# Patient Record
Sex: Female | Born: 1995
Health system: Southern US, Community
[De-identification: ages and names within clinical notes are randomized; demographics above are authoritative.]

## PROBLEM LIST (undated history)

## (undated) DIAGNOSIS — F909 Attention-deficit hyperactivity disorder, unspecified type: Secondary | ICD-10-CM

## (undated) DIAGNOSIS — N926 Irregular menstruation, unspecified: Secondary | ICD-10-CM

## (undated) DIAGNOSIS — F3130 Bipolar disorder, current episode depressed, mild or moderate severity, unspecified: Secondary | ICD-10-CM

## (undated) DIAGNOSIS — N201 Calculus of ureter: Secondary | ICD-10-CM

## (undated) HISTORY — PX: TYMPANOSTOMY TUBE PLACEMENT: SHX32

---
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
## 2016-08-31 DIAGNOSIS — F340 Cyclothymic disorder: Secondary | ICD-10-CM | POA: Diagnosis not present

## 2016-10-17 DIAGNOSIS — S62607A Fracture of unspecified phalanx of left little finger, initial encounter for closed fracture: Secondary | ICD-10-CM | POA: Diagnosis not present

## 2016-10-25 DIAGNOSIS — S62607D Fracture of unspecified phalanx of left little finger, subsequent encounter for fracture with routine healing: Secondary | ICD-10-CM | POA: Diagnosis not present

## 2016-11-19 DIAGNOSIS — S62607D Fracture of unspecified phalanx of left little finger, subsequent encounter for fracture with routine healing: Secondary | ICD-10-CM | POA: Diagnosis not present

## 2017-02-15 DIAGNOSIS — F340 Cyclothymic disorder: Secondary | ICD-10-CM | POA: Diagnosis not present

## 2018-02-06 DIAGNOSIS — Z124 Encounter for screening for malignant neoplasm of cervix: Secondary | ICD-10-CM | POA: Diagnosis not present

## 2018-02-06 DIAGNOSIS — Z113 Encounter for screening for infections with a predominantly sexual mode of transmission: Secondary | ICD-10-CM | POA: Diagnosis not present

## 2018-02-06 DIAGNOSIS — Z6836 Body mass index (BMI) 36.0-36.9, adult: Secondary | ICD-10-CM | POA: Diagnosis not present

## 2018-02-06 DIAGNOSIS — Z01419 Encounter for gynecological examination (general) (routine) without abnormal findings: Secondary | ICD-10-CM | POA: Diagnosis not present

## 2018-02-17 DIAGNOSIS — M25572 Pain in left ankle and joints of left foot: Secondary | ICD-10-CM | POA: Diagnosis not present

## 2018-03-26 DIAGNOSIS — M25572 Pain in left ankle and joints of left foot: Secondary | ICD-10-CM | POA: Diagnosis not present

## 2018-04-24 DIAGNOSIS — Z6836 Body mass index (BMI) 36.0-36.9, adult: Secondary | ICD-10-CM | POA: Diagnosis not present

## 2018-04-24 DIAGNOSIS — W268XXA Contact with other sharp object(s), not elsewhere classified, initial encounter: Secondary | ICD-10-CM | POA: Diagnosis not present

## 2018-04-24 DIAGNOSIS — S61210A Laceration without foreign body of right index finger without damage to nail, initial encounter: Secondary | ICD-10-CM | POA: Diagnosis not present

## 2018-05-26 DIAGNOSIS — L300 Nummular dermatitis: Secondary | ICD-10-CM | POA: Diagnosis not present

## 2018-05-26 DIAGNOSIS — D224 Melanocytic nevi of scalp and neck: Secondary | ICD-10-CM | POA: Diagnosis not present

## 2018-05-26 DIAGNOSIS — D225 Melanocytic nevi of trunk: Secondary | ICD-10-CM | POA: Diagnosis not present

## 2018-06-04 ENCOUNTER — Encounter

## 2018-06-04 ENCOUNTER — Ambulatory Visit (INDEPENDENT_AMBULATORY_CARE_PROVIDER_SITE_OTHER): Payer: BLUE CROSS/BLUE SHIELD | Admitting: Psychiatry

## 2018-06-04 ENCOUNTER — Encounter: Payer: Self-pay | Admitting: Psychiatry

## 2018-06-04 VITALS — BP 122/74 | HR 76 | Ht 63.0 in | Wt 208.0 lb

## 2018-06-04 DIAGNOSIS — F340 Cyclothymic disorder: Secondary | ICD-10-CM | POA: Diagnosis not present

## 2018-06-04 DIAGNOSIS — F902 Attention-deficit hyperactivity disorder, combined type: Secondary | ICD-10-CM | POA: Diagnosis not present

## 2018-06-04 MED ORDER — METHYLPHENIDATE HCL ER (OSM) 36 MG PO TBCR: 36.0000 mg | EXTENDED_RELEASE_TABLET | Freq: Every day | ORAL | 0 refills | Status: DC

## 2018-06-04 NOTE — Progress Notes (Signed)
[facility]  Patient ID: JOZEY SCHOOLER,  MRN: [id number]  PCP: Loyola Mast, MD  Date of Evaluation: 06/04/2018 Time spent:20 minutes  Chief Complaint:  Chief Complaint    ADHD; Depression      HISTORY/CURRENT STATUS: Gage is seen individually face-to-face with consent not collateral for psychiatric interview and exam in 23-month evaluation and management of cyclothymia and ADHD being 9 months overdue.  She discontinued bupropion describing side effects and not helping in March of this year 6 months after last appointment and now presents for ADHD medications.  She has collaborated with mother in assessing symptoms and associations finding necessity to improve focus at least over the latter half of her day shift on the job at [employer] through the late afternoon into the early evening.  Economics are difficult living alone with 2 dogs.  She feels that ADHD symptoms are triggering some depression this fall but denies recent hypomania.  She has no self-harm, mania, ecchymosis or substance use.   Individual Medical History/ Review of Systems: Changes? :No   Allergies: Patient has no known allergies.   Newly starting Concerta off of bupropion Current Medications:  Current Outpatient Medications:  .  cephALEXin (KEFLEX) 500 MG capsule, Take 1 capsule (500 mg total) by mouth 2 (two) times daily., Disp: 6 capsule, Rfl: 0 .  ibuprofen (ADVIL,MOTRIN) 200 MG tablet, Take 400 mg by mouth every 8 (eight) hours as needed for pain. , Disp: , Rfl:  .  Meth-Hyo-M Bl-Na Phos-Ph Sal (URIBEL) 118 MG CAPS, Take 1 capsule by mouth every 6 (six) hours as needed (for bladder)., Disp: , Rfl:  .  methylphenidate 36 MG PO CR tablet, Take 1 tablet (36 mg total) by mouth daily before lunch., Disp: 30 tablet, Rfl: 0 .  [START ON 07/04/2018] methylphenidate 36 MG PO CR tablet, Take 1 tablet (36 mg total) by mouth daily before lunch., Disp: 30 tablet, Rfl: 0 .  [START ON 08/03/2018] methylphenidate 36 MG PO  CR tablet, Take 1 tablet (36 mg total) by mouth daily before lunch., Disp: 30 tablet, Rfl: 0 .  Multiple Vitamin (MULTIVITAMIN WITH MINERALS) TABS, Take 1 tablet by mouth every morning. , Disp: , Rfl:  .  ondansetron (ZOFRAN-ODT) 4 MG disintegrating tablet, Take 4 mg by mouth every 8 (eight) hours as needed for nausea., Disp: , Rfl:  .  oxyCODONE-acetaminophen (ROXICET) 5-325 MG per tablet, Take 2 tablets by mouth every 6 (six) hours as needed for pain., Disp: 30 tablet, Rfl: 0 .  promethazine (PHENERGAN) 12.5 MG tablet, Take 12.5 mg by mouth every 6 (six) hours as needed for nausea., Disp: , Rfl:  .  tamsulosin (FLOMAX) 0.4 MG CAPS, Take 0.4 mg by mouth daily after supper. , Disp: , Rfl:  Medication Side Effects: fatigue/weakness and GI irritation  Family Medical/ Social History: Changes? No  MENTAL HEALTH EXAM: Muscle strength 5/5, postural reflexes 0/0 and AIMS = 0 Blood pressure 122/74, pulse 76, height 5\' 3"  (1.6 m), weight 208 lb (94.3 kg).Body mass index is 36.85 kg/m.  General Appearance: Casual, Fairly Groomed, Guarded and Obese  Eye Contact:  Good  Speech:  Clear and Coherent  Volume:  Normal  Mood:  Dysphoric and Euthymic  Affect:  Full Range  Thought Process:  Goal Directed  Orientation:  Full (Time, Place, and Person)  Thought Content: Obsessions   Suicidal Thoughts:  No  Homicidal Thoughts:  No  Memory:  Immediate;   Good Remote;   Good  Judgement:  Fair  Insight:  Fair  Psychomotor Activity:  Increased  Concentration:  Concentration: Fair and Attention Span: Poor  Recall:  Good  Fund of Knowledge: Good  Language: Good  Assets:  Resilience Social Support Talents/Skills  ADL's:  Intact  Cognition: WNL  Prognosis:  Good    DIAGNOSES:    ICD-10-CM   1. Attention deficit hyperactivity disorder (ADHD), combined type, moderate F90.2 methylphenidate 36 MG PO CR tablet    methylphenidate 36 MG PO CR tablet    methylphenidate 36 MG PO CR tablet  2. Cyclothymic  disorder F34.0     Receiving Psychotherapy: No    RECOMMENDATIONS: Bupropion is discontinued and mood monitored as per education today.  Reeducation on Concerta 36 mg every morning sent as a month supply each for January, February, and March to [facility] requires for ADHD follow-up in 6 months.  Close monitoring for hypomania as planned in case needed.  Therapeutic integration of symptom the last 10 years is processed.   Chauncey Mann, MD

## 2018-06-11 ENCOUNTER — Telehealth: Payer: Self-pay

## 2018-06-11 ENCOUNTER — Telehealth: Payer: Self-pay | Admitting: Psychiatry

## 2018-06-11 MED ORDER — METHYLPHENIDATE HCL ER 36 MG PO TB24: 36.0000 mg | ORAL_TABLET | Freq: Every day | ORAL | 0 refills | Status: DC

## 2018-06-11 NOTE — Telephone Encounter (Signed)
Let pt know methylphenidate er 36 mg is not covered under her plan. Instructed pt to contact insurance to get names of stimulants that are included on her plan. Pt verbalized understanding and will call back with the information.

## 2018-06-11 NOTE — Telephone Encounter (Signed)
Patient phoned office that insurance needed prior authorization requiring office contact to [facility] to conclude that the insurance does not cover Methylphenidate HCl ER 36 mg TBCR regardless of any authorization process.  As offered to the family by [facility] benefit provision guideline, mother addressed this reporting to office the need for written generic including d.a.w. 9 on the eScription sent [facility] [address] for a month supply canceling the preceding generic prescription of 06/04/2018 medically necessary with no contraindication.

## 2018-06-24 ENCOUNTER — Telehealth: Payer: Self-pay | Admitting: Psychiatry

## 2018-06-24 DIAGNOSIS — F902 Attention-deficit hyperactivity disorder, combined type: Secondary | ICD-10-CM

## 2018-06-24 MED ORDER — DEXMETHYLPHENIDATE HCL ER 20 MG PO CP24: 20.0000 mg | ORAL_CAPSULE | Freq: Every day | ORAL | 0 refills | Status: DC

## 2018-06-24 NOTE — Telephone Encounter (Signed)
Patients mother Fuller PlanConnie Schwer left vm stating she is having trouble getting the script for  Concerta filled the cost is $322 a month [employer] cannot afford this medication, Junious DresserConnie would like a call to discuss other alternatives that will be cost effective [phone]

## 2018-06-24 NOTE — Telephone Encounter (Signed)
Mother phones that despite this office and her family following all instructions of the insurance company after prior authorizations, the cost is prohibitive at $322 a month for methylphenidate 36 mg CR once daily.  She now has from 3 different people at [employer] company that they expect dexmethylphenidate ER or Focalin XR so that 20 mg XR every morning #30 no refills is [facility] [address] [facility] as outlined closer for patient who has had to make multiple trips already as mother predicts more prior authorizations as though no product is approved by the insurance.  [place] registry of controlled substances documents no stimulant has been filled by the patient since 2018 current being medically necessary with no contraindication having taken the Focalin 20 mg XR 09/11/2005.

## 2018-07-07 ENCOUNTER — Telehealth: Payer: Self-pay | Admitting: Psychiatry

## 2018-07-07 ENCOUNTER — Telehealth: Payer: Self-pay

## 2018-07-07 DIAGNOSIS — F902 Attention-deficit hyperactivity disorder, combined type: Secondary | ICD-10-CM

## 2018-07-07 MED ORDER — DEXMETHYLPHENIDATE HCL ER 20 MG PO CP24: 20.0000 mg | ORAL_CAPSULE | Freq: Every day | ORAL | 0 refills | Status: DC

## 2018-07-07 NOTE — Telephone Encounter (Signed)
Patient and [facility] report that 06/24/2018 Focalin 20 mg XR every morning #30 is not received there even though registered by epic as having been sent and [place] registry confirms not filled.  Prescription is resent by E scription documented as sent by epic to [facility] [address].

## 2018-07-07 NOTE — Telephone Encounter (Signed)
Pharmacy called and said that they never received the script for the focalin. Can you re [facility]ribe [facility] [address]

## 2018-07-07 NOTE — Telephone Encounter (Signed)
Prior authorization approved for Focalin XR 20mg  through [facility] effective 07/07/2018-07/06/2021  [facility] [phone]

## 2018-08-07 ENCOUNTER — Telehealth: Payer: Self-pay

## 2018-08-07 ENCOUNTER — Telehealth: Payer: Self-pay | Admitting: Psychiatry

## 2018-08-07 DIAGNOSIS — F902 Attention-deficit hyperactivity disorder, combined type: Secondary | ICD-10-CM

## 2018-08-07 MED ORDER — AMPHETAMINE-DEXTROAMPHETAMINE 5 MG PO TABS
5.0000 mg | ORAL_TABLET | Freq: Three times a day (TID) | ORAL | 0 refills | Status: DC
Start: 2018-08-07 — End: 2019-05-04

## 2018-08-07 NOTE — Telephone Encounter (Signed)
Prior authorization submitted to [facility] for Amphetamine-dextroamphetamine 5mg  effective 08/07/2018-08/06/2021  [facility] approval

## 2018-08-07 NOTE — Telephone Encounter (Signed)
Per mother [name] has been unable to fill any of the ADD Rxs sent in due either to increased cost or need of PA.  Mother has checked with [facility] and they can use a coupon for regular Adderall and she can afford this. Can this be sent in for her?

## 2018-08-07 NOTE — Telephone Encounter (Signed)
Mother phones as patient will not do so but directs that she will accept mother's help to for ADHD.  The Focalin although preauthorized would cost them $120 instead of the [employer] company promised them.  Patient needs treatment for the ADHD particularly relative to current employment.  They have a good Rx coupon for Adderall IR tablet cost of which they are willing to accept.  Though Adderall had a horrible effect on her mood as the first stimulant she never took when very young, they are willing to try the 5 mg IR Adderall up to 3 times daily #90 with no refill medically necessary with no contraindication sent to [facility].

## 2018-12-03 ENCOUNTER — Ambulatory Visit: Payer: BLUE CROSS/BLUE SHIELD | Admitting: Psychiatry

## 2019-05-04 ENCOUNTER — Ambulatory Visit (INDEPENDENT_AMBULATORY_CARE_PROVIDER_SITE_OTHER): Payer: BC Managed Care – PPO | Admitting: Psychiatry

## 2019-05-04 ENCOUNTER — Encounter: Payer: Self-pay | Admitting: Psychiatry

## 2019-05-04 ENCOUNTER — Other Ambulatory Visit: Payer: Self-pay

## 2019-05-04 VITALS — Ht 63.0 in | Wt 206.0 lb

## 2019-05-04 DIAGNOSIS — F340 Cyclothymic disorder: Secondary | ICD-10-CM | POA: Diagnosis not present

## 2019-05-04 DIAGNOSIS — F411 Generalized anxiety disorder: Secondary | ICD-10-CM | POA: Diagnosis not present

## 2019-05-04 DIAGNOSIS — F902 Attention-deficit hyperactivity disorder, combined type: Secondary | ICD-10-CM

## 2019-05-04 MED ORDER — METHYLPHENIDATE HCL 20 MG PO TABS: 20.0000 mg | ORAL_TABLET | Freq: Two times a day (BID) | ORAL | 0 refills | Status: DC

## 2019-05-04 MED ORDER — FLUOXETINE HCL 20 MG PO CAPS: 20.0000 mg | ORAL_CAPSULE | Freq: Every day | ORAL | 5 refills | Status: DC

## 2019-05-04 NOTE — Progress Notes (Signed)
[facility]  Patient ID: [name],  MRN: [id number]  PCP: Veronica Hummer, MD  Date of Evaluation: 05/04/2019 Time spent:20 minutes from 1040 to 1100  Chief Complaint:  Chief Complaint    ADHD; Anxiety; Depression; Manic Behavior      HISTORY/CURRENT STATUS: [name] is seen onsite in office 20 minutes face-to-face individually with consent with epic collateral for psychiatric interview and exam in 71-month evaluation and management of ADHD, generalized anxiety, and cyclothymia.  She is 5 months overdue having months of insurance and pharmacy debates about the expense and psychodynamics of her ADHD treatment after last appointment changing from extended release methylphenidate or Focalin to Adderall 5 mg IR 3 times daily with last and only fill being 08/07/2018 per [facility] registry dispensing.  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  She reports her 10-year anniversary with girlfriend spent in the [place] with girlfriend now requires the patient to get an appointment for anxiety and some seasonal flareups to depressive symptoms with no hypomania or anger outburst now for years.  Patient states she is still on parents insurance but lives by herself.  Her girlfriend recommends Prozac she has never taken an SSRI in the past due to her anger and mood swings.  She also seeks methylphenidate rather than amphetamine in the short acting tablets especially for school.  She debates about pharmacies and health plans as well as cost and quality of care.  She has no mania, suicidality, psychosis, or delirium.  The she has never had a manic episode though hypomanic and reactive dysregulated mood symptoms are significantly resolved and worked through. The benefits and risks are therefore processed relative to Prozac, though by experience she will tolerate the Ritalin well.  Anxiety Presents  for initial visit. Onset was more than 5 years ago. The problem has been waxing and waning. Symptoms include decreased concentration, depressed mood, excessive worry, insomnia and nervous/anxious behavior. Patient reports no compulsions, irritability, obsessions, panic or suicidal ideas. Symptoms occur most days. The severity of symptoms is moderate and causing significant distress. The symptoms are aggravated by social activities and work stress. The quality of sleep is fair. Nighttime awakenings: one to two.   Risk factors include change in medication, a major life event and family history. Her past medical history is significant for anxiety/panic attacks and depression. Past treatments include counseling (CBT), lifestyle changes and non-benzodiazephine anxiolytics. The treatment provided moderate relief. Compliance with prior treatments has been poor. Prior compliance problems include difficulty with treatment plan and medication issues.    Individual Medical History/ Review of Systems: Changes? :No Weight is down 2 pounds in the last year  Allergies: Patient has no known allergies.  Current Medications:  Current Outpatient Medications:  .  cephALEXin (KEFLEX) 500 MG capsule, Take 1 capsule (500 mg total) by mouth 2 (two) times daily., Disp: 6 capsule, Rfl: 0 .  FLUoxetine (PROZAC) 20 MG capsule, Take 1 capsule (20 mg total) by mouth daily., Disp: 30 capsule, Rfl: 5 .  ibuprofen (ADVIL,MOTRIN) 200 MG tablet, Take 400 mg by mouth every 8 (eight) hours as needed for pain. , Disp: , Rfl:  .  Meth-Hyo-M Bl-Na Phos-Ph Sal (URIBEL) 118 MG CAPS, Take 1 capsule by mouth every 6 (six) hours as needed (for bladder)., Disp: , Rfl:  .  methylphenidate (RITALIN) 20 MG tablet, Take 1 tablet (20 mg total) by mouth  2 (two) times daily., Disp: 60 tablet, Rfl: 0 .  [START ON 06/03/2019] methylphenidate (RITALIN) 20 MG tablet, Take 1 tablet (20 mg total) by mouth 2 (two) times daily., Disp: 60 tablet, Rfl: 0 .   [START ON 07/03/2019] methylphenidate (RITALIN) 20 MG tablet, Take 1 tablet (20 mg total) by mouth 2 (two) times daily., Disp: 60 tablet, Rfl: 0 .  Multiple Vitamin (MULTIVITAMIN WITH MINERALS) TABS, Take 1 tablet by mouth every morning. , Disp: , Rfl:  .  ondansetron (ZOFRAN-ODT) 4 MG disintegrating tablet, Take 4 mg by mouth every 8 (eight) hours as needed for nausea., Disp: , Rfl:  .  oxyCODONE-acetaminophen (ROXICET) 5-325 MG per tablet, Take 2 tablets by mouth every 6 (six) hours as needed for pain., Disp: 30 tablet, Rfl: 0 .  promethazine (PHENERGAN) 12.5 MG tablet, Take 12.5 mg by mouth every 6 (six) hours as needed for nausea., Disp: , Rfl:  .  tamsulosin (FLOMAX) 0.4 MG CAPS, Take 0.4 mg by mouth daily after supper. , Disp: , Rfl:    Medication Side Effects: none  Family Medical/ Social History: Changes? No  MENTAL HEALTH EXAM:  Height 5\' 3"  (1.6 m), weight 206 lb (93.4 kg).Body mass index is 36.49 kg/m. Muscle strengths and tone 5/5, postural reflexes and gait 0/0, and AIMS = 0 otherwise deferred for coronavirus shutdown  General Appearance: Casual, Fairly Groomed, Guarded, Meticulous and Obese  Eye Contact:  Good  Speech:  Clear and Coherent, Normal Rate, Slow and Talkative  Volume:  Normal  Mood:  Anxious, Dysphoric, Euthymic and Worthless  Affect:  Congruent, Depressed, Inappropriate, Labile, Restricted and Anxious  Thought Process:  Coherent, Goal Directed, Irrelevant, Linear and Descriptions of Associations: Tangential  Orientation:  Full (Time, Place, and Person)  Thought Content: Ilusions, Rumination and Tangential   Suicidal Thoughts:  No  Homicidal Thoughts:  No  Memory:  Immediate;   Good Remote;   Good  Judgement:  Fair  Insight:  Fair  Psychomotor Activity:  Normal, Increased, Mannerisms and Restlessness  Concentration:  Concentration: Fair and Attention Span: Fair  Recall:  [employer]  Fund of Knowledge: Good  Language: Good  Assets:  Leisure  Time Resilience Talents/Skills Vocational/Educational  ADL's:  Intact  Cognition: WNL  Prognosis:  Good    DIAGNOSES:    ICD-10-CM   1. Attention deficit hyperactivity disorder (ADHD), combined type, mild  F90.2 methylphenidate (RITALIN) 20 MG tablet    methylphenidate (RITALIN) 20 MG tablet    methylphenidate (RITALIN) 20 MG tablet  2. Generalized anxiety disorder  F41.1 FLUoxetine (PROZAC) 20 MG capsule  3. Cyclothymic disorder  F34.0     Receiving Psychotherapy: No    RECOMMENDATIONS: The patient has 13.5 years of treatment in this office that can be summarized and integrated to dilatated decision making regarding her request for anxiety, seasonal mood, and inattention treatment.  There is no current potential contraindication to the Prozac though previous sustained diagnosis of cyclothymia warrants monitoring for any hypomanic symptoms as reviewed today.  Psychosupportive psychoeducation integrates past psychotherapies including here for applications to symptom treatment matching for prevention and monitoring and safety hygiene.  She is prescribed to start Prozac 20 mg daily sent as #30 with 5 refills to [facility] on [address] for anxiety and seasonal dysthymic symptoms.  She is E scribed Ritalin 20 mg IR tablet twice daily sent as #60 each for December 7, January 6, and [facility] as patient researches for ADHD.  She returns  in 6 months for follow-up or sooner if needed.   Chauncey Mann, MD

## 2019-06-15 DIAGNOSIS — Z6837 Body mass index (BMI) 37.0-37.9, adult: Secondary | ICD-10-CM | POA: Diagnosis not present

## 2019-06-15 DIAGNOSIS — Z01419 Encounter for gynecological examination (general) (routine) without abnormal findings: Secondary | ICD-10-CM | POA: Diagnosis not present

## 2019-11-02 ENCOUNTER — Ambulatory Visit: Payer: BC Managed Care – PPO | Admitting: Psychiatry

## 2019-12-01 ENCOUNTER — Encounter: Payer: Self-pay | Admitting: Psychiatry

## 2019-12-01 ENCOUNTER — Ambulatory Visit (INDEPENDENT_AMBULATORY_CARE_PROVIDER_SITE_OTHER): Payer: BC Managed Care – PPO | Admitting: Psychiatry

## 2019-12-01 ENCOUNTER — Other Ambulatory Visit: Payer: Self-pay

## 2019-12-01 VITALS — Ht 63.0 in | Wt 233.0 lb

## 2019-12-01 DIAGNOSIS — F902 Attention-deficit hyperactivity disorder, combined type: Secondary | ICD-10-CM

## 2019-12-01 DIAGNOSIS — F340 Cyclothymic disorder: Secondary | ICD-10-CM | POA: Diagnosis not present

## 2019-12-01 DIAGNOSIS — F411 Generalized anxiety disorder: Secondary | ICD-10-CM

## 2019-12-01 MED ORDER — FLUOXETINE HCL 20 MG PO CAPS: 20.0000 mg | ORAL_CAPSULE | Freq: Every day | ORAL | 5 refills | Status: DC

## 2019-12-01 MED ORDER — ATOMOXETINE HCL 25 MG PO CAPS: 25.0000 mg | ORAL_CAPSULE | Freq: Every day | ORAL | 5 refills | Status: DC

## 2019-12-01 NOTE — Progress Notes (Signed)
[facility]  Patient ID: [name],  MRN: [id number]  PCP: Loyola Mast, MD  Date of Evaluation: 12/01/2019 Time spent:25 minutes from 1325 to 1350  Chief Complaint:  Chief Complaint    Anxiety; Depression; Manic Behavior; ADHD      HISTORY/CURRENT STATUS: [name] is seen onsite in office 25 minutes face-to-face individually with consent with epic collateral for psychiatric interview and exam in 70-month evaluation and management of ADHD, generalized anxiety, and cyclothymia being 1 month overdue worked in for appointment by reception today having missed last appointment she attributes to messages by the office being left at mother's home number rather than her own though acknowledging neither listens to messages. She is pleased with her progress for anxiety and somewhat her mood in the interim but she has gained 27 pounds in 13-months. She reviews weight gain likely from stress eating associated with academics again not taking her Ritalin questioning whether Prozac might contribute acknowledging that she has not taken the Ritalin very often and that overeating is an anxious concern even as she feels better overall.  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  She intends to complete her schooling but is aware of that her focus and sustained attention are inadequate for her academic needs at this time.  She and mother struggled March 2020 with finding medication the insurance company would cover, patient stating [employer] does not approve of her treatment as an adult especially when off stimulants in the interim since her teen treatment as though she would not need ADHD treatment any longer.  Patient has a half bottle of Ritalin 20 mg IR tablets remaining from her last appointment 7 months ago filling only 1 eScription considering the medication hard on her anger, mood, and  anxiety as well as GI tract.  She continues research trying to find inexpensive way to be medicated obtaining her Prozac for $4 per month at [facility] and researching use of Good[facility] rather than her insurance concluding Karin Golden best for next option if any.  Ritalin IR can still be used for projects and test preparation but she has a friend who has done well with Strattera consistent dosing.  She has no mania, suicidality, psychosis or delirium.  Anxiety  Presents for follow up visit for onset one year ago. The problem has been waxing and waning. Symptoms include decreased concentration, depressed mood, excessive worry, and nervous/anxious behavior. Patient reports no compulsions, irritability, obsessions, panic,  insomnia, or suicidal ideas. Symptoms occur most days. The severity of symptoms is moderate and causing significant distress. The symptoms are aggravated by social activities and work stress. The quality of sleep is fair. Nighttime awakenings: one to two. Risk factors include change in medication, a major life event and family history. Her past medical history is significant for anxiety/panic attacks and depression. Past treatments include counseling (CBT), lifestyle changes and non-benzodiazephine anxiolytics. The treatment provided moderate relief. Compliance with prior treatments has been poor. Prior compliance problems include difficulty with treatment plan and medication issues.   Individual Medical History/ Review of Systems: Changes? :Yes Weight gain of 27 pounds in 7 months. Allergies: Patient has no known allergies.  Current Medications:  Current Outpatient Medications:  .  atomoxetine (STRATTERA) 25 MG capsule, Take 1 capsule (25 mg total) by mouth daily after breakfast., Disp: 30 capsule, Rfl: 5 .  cephALEXin (KEFLEX) 500 MG capsule, Take  1 capsule (500 mg total) by mouth 2 (two) times daily., Disp: 6 capsule, Rfl: 0 .  FLUoxetine (PROZAC) 20 MG capsule, Take 1  capsule (20 mg total) by mouth daily., Disp: 30 capsule, Rfl: 5 .  ibuprofen (ADVIL,MOTRIN) 200 MG tablet, Take 400 mg by mouth every 8 (eight) hours as needed for pain. , Disp: , Rfl:  .  Meth-Hyo-M Bl-Na Phos-Ph Sal (URIBEL) 118 MG CAPS, Take 1 capsule by mouth every 6 (six) hours as needed (for bladder)., Disp: , Rfl:  .  methylphenidate (RITALIN) 20 MG tablet, Take 1 tablet (20 mg total) by mouth 2 (two) times daily., Disp: 60 tablet, Rfl: 0 .  methylphenidate (RITALIN) 20 MG tablet, Take 1 tablet (20 mg total) by mouth 2 (two) times daily., Disp: 60 tablet, Rfl: 0 .  methylphenidate (RITALIN) 20 MG tablet, Take 1 tablet (20 mg total) by mouth 2 (two) times daily., Disp: 60 tablet, Rfl: 0 .  Multiple Vitamin (MULTIVITAMIN WITH MINERALS) TABS, Take 1 tablet by mouth every morning. , Disp: , Rfl:  .  ondansetron (ZOFRAN-ODT) 4 MG disintegrating tablet, Take 4 mg by mouth every 8 (eight) hours as needed for nausea., Disp: , Rfl:  .  oxyCODONE-acetaminophen (ROXICET) 5-325 MG per tablet, Take 2 tablets by mouth every 6 (six) hours as needed for pain., Disp: 30 tablet, Rfl: 0 .  promethazine (PHENERGAN) 12.5 MG tablet, Take 12.5 mg by mouth every 6 (six) hours as needed for nausea., Disp: , Rfl:  .  tamsulosin (FLOMAX) 0.4 MG CAPS, Take 0.4 mg by mouth daily after supper. , Disp: , Rfl:   Medication Side Effects: weight gain  Family Medical/ Social History: Changes? No  MENTAL HEALTH EXAM:  Height 5\' 3"  (1.6 m), weight 233 lb (105.7 kg).Body mass index is 41.27 kg/m. Muscle strengths and tone 5/5, postural reflexes and gait 0/0, and AIMS = 0.  General Appearance: Casual, Fairly Groomed, Guarded, Meticulous and Obese  Eye Contact:  Good  Speech:  Clear and Coherent, Normal Rate and Talkative  Volume:  Normal  Mood:  Anxious, Dysphoric, Euthymic and Worthless  Affect:  Congruent, Depressed, Inappropriate, Labile and Anxious  Thought Process:  Coherent, Goal Directed, Irrelevant, Linear and  Descriptions of Associations: Tangential  Orientation:  Full (Time, Place, and Person)  Thought Content: Rumination and Tangential   Suicidal Thoughts:  No  Homicidal Thoughts:  No  Memory:  Immediate;   Good Remote;   Good  Judgement:  Fair  Insight:  Fair  Psychomotor Activity:  Normal, Increased, Decreased and Mannerisms  Concentration:  Concentration: Fair and Attention Span: Fair  Recall:  of Knowledge: Good  Language: Good  Assets:  Desire for Improvement Leisure Time Talents/Skills Vocational/Educational  ADL's:  Intact  Cognition: WNL  Prognosis:  Good    DIAGNOSES:    ICD-10-CM   1. Generalized anxiety disorder  F41.1 FLUoxetine (PROZAC) 20 MG capsule  2. Attention deficit hyperactivity disorder (ADHD), combined type, mild  F90.2 atomoxetine (STRATTERA) 25 MG capsule  3. Cyclothymic disorder  F34.0     Receiving Psychotherapy: No    RECOMMENDATIONS: Anxiety even more than mood is improved and stable as symptom treatment matching concludes to continue eScription for Prozac 20 mg every morning sent as #30 with 5 refills to [facility] for generalized anxiety and cyclothymia.  She is able to continue Ritalin 20 mg IR twice daily as needed for projects and presentations academically but finds it is not a good medicine  for side effects with continuous use therefore she has half a bottle remaining from the initial dispensing from last December.  She is E scribed Strattera 25 mg every morning sent as #30 with 5 refills to [facility] [address] for ADHD.  Prevention and monitoring psychoeducation is provided for safety hygiene and crisis plans if needed. She returns for follow-up in 6 months or sooner if needed.  Adderall may be another option to the Ritalin and Strattera although insurance would not cover it in March 2020 and patient hesitates to try again at this time.   Chauncey Mann, MD

## 2020-03-16 ENCOUNTER — Encounter: Payer: Self-pay | Admitting: Psychiatry

## 2020-04-26 DIAGNOSIS — J039 Acute tonsillitis, unspecified: Secondary | ICD-10-CM | POA: Diagnosis not present

## 2020-05-10 ENCOUNTER — Ambulatory Visit (INDEPENDENT_AMBULATORY_CARE_PROVIDER_SITE_OTHER): Payer: BC Managed Care – PPO | Admitting: Psychiatry

## 2020-05-10 ENCOUNTER — Other Ambulatory Visit: Payer: Self-pay

## 2020-05-10 ENCOUNTER — Encounter: Payer: Self-pay | Admitting: Psychiatry

## 2020-05-10 VITALS — Ht 63.0 in | Wt 249.0 lb

## 2020-05-10 DIAGNOSIS — F902 Attention-deficit hyperactivity disorder, combined type: Secondary | ICD-10-CM

## 2020-05-10 DIAGNOSIS — F340 Cyclothymic disorder: Secondary | ICD-10-CM | POA: Diagnosis not present

## 2020-05-10 DIAGNOSIS — F411 Generalized anxiety disorder: Secondary | ICD-10-CM

## 2020-05-10 MED ORDER — METHYLPHENIDATE HCL 20 MG PO TABS: 20.0000 mg | ORAL_TABLET | Freq: Two times a day (BID) | ORAL | 0 refills | Status: AC

## 2020-05-10 MED ORDER — FLUOXETINE HCL 20 MG PO CAPS: 20.0000 mg | ORAL_CAPSULE | Freq: Every day | ORAL | 11 refills | Status: DC

## 2020-05-10 NOTE — Progress Notes (Signed)
[facility]  Patient ID: [name],  MRN: [id number]  PCP: Loyola Mast, MD  Date of Evaluation: 05/10/2020 Time spent:25 minutes from 1520 to 1545  Chief Complaint:  Chief Complaint    Depression; Manic Behavior; Anxiety; ADHD      HISTORY/CURRENT STATUS: [name] seen onsite in office 25 minutes face-to-face individually with consent with epic collateral for psychiatric interview and exam in 53-month evaluation and management of generalized anxiety, cyclothymia, and ADHD.  Patient is comfortable and committed to 60-month follow-up of pharmacotherapy being 1 month early this time due to my case closure for imminent retirement and need for transfer transition to advanced practitioner.  Patient has been in treatment here for 14 years reviewing today summary of course of family relations, paradigms for learning, and self directed mental and medical health responsibility.  Still working full-time with [employer] to get her license after completing [employer] in 6 months, the patient reports leaving her new residence in [place] before sunrise and returning 15 hours later in the evening relative to work and school.  She is pleased with anxiety and mood regulation from Prozac accepting girlfriend's suggestion after doing best with Lamictal and Wellbutrin through her angry adolescence.  She combined daily Strattera 25 mg every morning with her Prozac 20 mg last appointment at the advice of another friend but did not follow through with the Strattera although she did with the Prozac.  She did not fill the Ritalin 20 mg IR tablets from last appointment with last dispensing per [place] registry being December 2020.  However she notes with the final stretch of school while busy at work, she needs the Ritalin renewed finding it better than previous Adderall 5 mg IR or Concerta 27-54 mg.  She still prefers Karin Golden at [facility] for the Ritalin and [facility] for the Prozac.  She has no mania,  suicidality, psychosis or delirium  Anxiety             Presents forfollow upvisit for onset over 2 years ago. The problem has beenwaxing and waning. Symptoms includedecreased concentration,depressed mood,compulsive routines such as overeating, excessive worry, and nervous/anxious behavior. Patient reports noirritability,obsessions,panic, rage,insomnia, or suicidal ideas. Symptoms occurmost days. The severity of symptoms ismoderate and causing significant distress. The symptoms are aggravated bysocial activities and work stress. The quality of sleep isfair. Nighttime awakenings:one to two. Risk factors includechange in medication, a major life event and family history. Her past medical history is significant foranxiety/panic attacksand depression. Past treatments includecounseling (CBT), lifestyle changes and non-benzodiazephine anxiolytics. The treatment providedmoderaterelief. Compliance with prior treatments has beenpoor. Prior compliance problems includedifficulty with treatment plan and medication issues.  Individual Medical History/ Review of Systems: Changes? :Yes Weight is up 16 pounds in 5 months after gaining 27 pounds in 7 months at last appointment.  Allergies: Patient has no known allergies.  Current Medications:  Current Outpatient Medications:  .  cephALEXin (KEFLEX) 500 MG capsule, Take 1 capsule (500 mg total) by mouth 2 (two) times daily., Disp: 6 capsule, Rfl: 0 .  FLUoxetine (PROZAC) 20 MG capsule, Take 1 capsule (20 mg total) by mouth daily., Disp: 30 capsule, Rfl: 11 .  ibuprofen (ADVIL,MOTRIN) 200 MG tablet, Take 400 mg by mouth every 8 (eight) hours as needed for pain. , Disp: , Rfl:  .  Meth-Hyo-M Bl-Na Phos-Ph Sal (URIBEL) 118 MG CAPS, Take 1 capsule by mouth every 6 (six) hours as needed (for bladder)., Disp: , Rfl:  .  methylphenidate (RITALIN) 20 MG tablet,  Take 1 tablet (20 mg total) by mouth 2 (two) times daily., Disp: 60 tablet, Rfl: 0 .  [START  ON 06/09/2020] methylphenidate (RITALIN) 20 MG tablet, Take 1 tablet (20 mg total) by mouth 2 (two) times daily., Disp: 60 tablet, Rfl: 0 .  [START ON 07/09/2020] methylphenidate (RITALIN) 20 MG tablet, Take 1 tablet (20 mg total) by mouth 2 (two) times daily., Disp: 60 tablet, Rfl: 0 .  Multiple Vitamin (MULTIVITAMIN WITH MINERALS) TABS, Take 1 tablet by mouth every morning. , Disp: , Rfl:  .  ondansetron (ZOFRAN-ODT) 4 MG disintegrating tablet, Take 4 mg by mouth every 8 (eight) hours as needed for nausea., Disp: , Rfl:  .  oxyCODONE-acetaminophen (ROXICET) 5-325 MG per tablet, Take 2 tablets by mouth every 6 (six) hours as needed for pain., Disp: 30 tablet, Rfl: 0 .  promethazine (PHENERGAN) 12.5 MG tablet, Take 12.5 mg by mouth every 6 (six) hours as needed for nausea., Disp: , Rfl:  .  tamsulosin (FLOMAX) 0.4 MG CAPS, Take 0.4 mg by mouth daily after supper. , Disp: , Rfl:   Medication Side Effects: weight gain  Family Medical/ Social History: Changes? No noting previously father may have ADHD, paternal grandmother may have taken antidepressants, and maternal grandfather had dementia and depression.  MENTAL HEALTH EXAM:  Height 5\' 3"  (1.6 m), weight 249 lb (112.9 kg).Body mass index is 44.11 kg/m. Muscle strengths and tone 5/5, postural reflexes and gait 0/0, and AIMS = 0 including relative to any previous concussion earlier this year.  General Appearance: Casual, Fairly Groomed, Guarded, Meticulous and Obese  Eye Contact:  Good  Speech:  Clear and Coherent, Normal Rate and Talkative  Volume:  Normal  Mood:  Anxious, Dysphoric and Euphoric  Affect:  Congruent, Depressed, Inappropriate, Labile and Anxious  Thought Process:  Coherent, Goal Directed, Irrelevant, Linear and Descriptions of Associations: Tangential  Orientation:  Full (Time, Place, and Person)  Thought Content: Rumination and Tangential   Suicidal Thoughts:  No  Homicidal Thoughts:  No  Memory:  Immediate;   Good Remote;    Good  Judgement:  Fair  Insight:  Fair  Psychomotor Activity:  Normal, Increased and Mannerisms  Concentration:  Concentration: Fair and Attention Span: Fair  Recall:  of Knowledge: Good  Language: Good  Assets:  Desire for Improvement Leisure Time Resilience Talents/Skills Vocational/Educational  ADL's:  Intact  Cognition: WNL  Prognosis:  Good    DIAGNOSES:    ICD-10-CM   1. Generalized anxiety disorder  F41.1 FLUoxetine (PROZAC) 20 MG capsule  2. Cyclothymic disorder  F34.0   3. Attention deficit hyperactivity disorder (ADHD), combined type, mild  F90.2 methylphenidate (RITALIN) 20 MG tablet    methylphenidate (RITALIN) 20 MG tablet    methylphenidate (RITALIN) 20 MG tablet    Receiving Psychotherapy: No    RECOMMENDATIONS: Psychosupportive psychoeducation integrates behavioral nutrition, sleep hygiene, social problem-solving, and frustration management with symptom treatment managing for medications concluding that Prozac has been helpful without adverse effects and is needed for both cyclothymia predominantly mixed depression and generalized anxiety as anger and disruptiveness of adolescence resolve.  She has some obsessional behavior especially overeating and ritualized work schedules.  She is E scribed Prozac 20 mg every morning after breakfast sent as #30 with 11 refills to [facility] [address] [facility] for cyclothymic mixed depression and generalized anxiety with compulsive features.  She is E scribed Ritalin 20 mg IR tablet twice daily sent as #60 each for December 14, January  13, and February 12 to [facility] on [address] for ADHD.  She returns for follow up with advanced practitioner in 6 months for my case closure in retirement.  She understands updating on prevention and monitoring safety hygiene of medications and diagnoses.   Chauncey Mann, MD

## 2020-06-02 ENCOUNTER — Ambulatory Visit: Payer: BC Managed Care – PPO | Admitting: Psychiatry

## 2020-06-08 DIAGNOSIS — R300 Dysuria: Secondary | ICD-10-CM | POA: Diagnosis not present

## 2020-06-27 DIAGNOSIS — R1084 Generalized abdominal pain: Secondary | ICD-10-CM | POA: Diagnosis not present

## 2020-06-27 DIAGNOSIS — Z20828 Contact with and (suspected) exposure to other viral communicable diseases: Secondary | ICD-10-CM | POA: Diagnosis not present

## 2020-06-27 DIAGNOSIS — K602 Anal fissure, unspecified: Secondary | ICD-10-CM | POA: Diagnosis not present

## 2020-06-29 DIAGNOSIS — K625 Hemorrhage of anus and rectum: Secondary | ICD-10-CM | POA: Diagnosis not present

## 2020-06-29 DIAGNOSIS — M549 Dorsalgia, unspecified: Secondary | ICD-10-CM | POA: Diagnosis not present

## 2020-06-29 DIAGNOSIS — Z6841 Body Mass Index (BMI) 40.0 and over, adult: Secondary | ICD-10-CM | POA: Diagnosis not present

## 2020-06-29 DIAGNOSIS — R1031 Right lower quadrant pain: Secondary | ICD-10-CM | POA: Diagnosis not present

## 2020-06-29 DIAGNOSIS — M545 Low back pain, unspecified: Secondary | ICD-10-CM | POA: Diagnosis not present

## 2020-07-04 DIAGNOSIS — K625 Hemorrhage of anus and rectum: Secondary | ICD-10-CM | POA: Diagnosis not present

## 2020-07-04 DIAGNOSIS — Z6841 Body Mass Index (BMI) 40.0 and over, adult: Secondary | ICD-10-CM | POA: Diagnosis not present

## 2020-07-04 DIAGNOSIS — Z1331 Encounter for screening for depression: Secondary | ICD-10-CM | POA: Diagnosis not present

## 2020-10-10 ENCOUNTER — Other Ambulatory Visit: Payer: Self-pay | Admitting: Psychiatry

## 2020-10-10 DIAGNOSIS — F411 Generalized anxiety disorder: Secondary | ICD-10-CM

## 2020-10-13 NOTE — Telephone Encounter (Signed)
Have pt set up with another provider if contenting care here

## 2020-10-18 NOTE — Telephone Encounter (Signed)
LM TCB for an appt 

## 2020-11-09 ENCOUNTER — Other Ambulatory Visit: Payer: Self-pay | Admitting: Psychiatry

## 2020-11-09 DIAGNOSIS — F411 Generalized anxiety disorder: Secondary | ICD-10-CM

## 2020-11-10 NOTE — Telephone Encounter (Signed)
Call to RS

## 2020-11-11 NOTE — Telephone Encounter (Signed)
Called Pt to schedule with new provider. Pt stated she will call back at later date to schedule.

## 2020-11-23 ENCOUNTER — Other Ambulatory Visit: Payer: Self-pay | Admitting: Psychiatry

## 2020-11-23 ENCOUNTER — Telehealth: Payer: Self-pay | Admitting: Behavioral Health

## 2020-11-23 DIAGNOSIS — F411 Generalized anxiety disorder: Secondary | ICD-10-CM

## 2020-11-23 MED ORDER — FLUOXETINE HCL 20 MG PO CAPS: 20.0000 mg | ORAL_CAPSULE | Freq: Every day | ORAL | 0 refills | Status: DC

## 2020-11-23 NOTE — Telephone Encounter (Signed)
Pt called in for refill for Prozac 20mg . Former pt of [facility] has appt 7/5. States she is completely out. Ph: [phone] Pharmacy [facility] [address] [phone].

## 2020-11-23 NOTE — Telephone Encounter (Signed)
sent 

## 2020-11-23 NOTE — Telephone Encounter (Signed)
Please send in refill for her for 1 month supply. Thank you.

## 2020-11-23 NOTE — Telephone Encounter (Signed)
Please review appt with brian on 7/5

## 2020-11-29 ENCOUNTER — Other Ambulatory Visit: Payer: Self-pay

## 2020-11-29 ENCOUNTER — Ambulatory Visit (INDEPENDENT_AMBULATORY_CARE_PROVIDER_SITE_OTHER): Payer: BC Managed Care – PPO | Admitting: Behavioral Health

## 2020-11-29 ENCOUNTER — Encounter: Payer: Self-pay | Admitting: Behavioral Health

## 2020-11-29 VITALS — Wt 256.0 lb

## 2020-11-29 DIAGNOSIS — F411 Generalized anxiety disorder: Secondary | ICD-10-CM

## 2020-11-29 DIAGNOSIS — F331 Major depressive disorder, recurrent, moderate: Secondary | ICD-10-CM | POA: Diagnosis not present

## 2020-11-29 DIAGNOSIS — F340 Cyclothymic disorder: Secondary | ICD-10-CM | POA: Diagnosis not present

## 2020-11-29 DIAGNOSIS — F902 Attention-deficit hyperactivity disorder, combined type: Secondary | ICD-10-CM | POA: Diagnosis not present

## 2020-11-29 DIAGNOSIS — F3132 Bipolar disorder, current episode depressed, moderate: Secondary | ICD-10-CM

## 2020-11-29 MED ORDER — FLUOXETINE HCL 40 MG PO CAPS: 40.0000 mg | ORAL_CAPSULE | Freq: Every day | ORAL | 3 refills | Status: DC

## 2020-11-29 MED ORDER — RISPERIDONE 1 MG PO TABS: 1.0000 mg | ORAL_TABLET | Freq: Every day | ORAL | 3 refills | Status: DC

## 2020-11-29 NOTE — Progress Notes (Signed)
[facility]  Patient ID: [name],  MRN: [id number]  PCP: Loyola Mast, MD  Date of Evaluation: 11/29/2020 Time spent:40 minutes  Chief Complaint:  Chief Complaint   Anxiety; Depression; Follow-up; Medication Refill; ADHD     HISTORY/CURRENT STATUS: HPI 25 year old female presents to this office for follow up and medication management. She says that she was previously receiving telepsychiatry from [facility] but it became to expensive. She is former patient of Dr. Beverly Milch. She said that she learned recently that she had previous diagnosis of Bipolar disorder but her parents never told her. She says that she has struggled with recurrent depression since early teenager but mood fluctuations and cycling occurs on frequent, sometimes daily basis. In working with [facility], she says that the clinician felt she was borderline personality disorder as well as Bipolar. Said she was placed on Risperidone and it has helped with moods. She reports depression today at 5/10 and anxiety at 9/10. She said she has had a rough day. Affect is non-congruent with self-report. Pt is smiling and delightful. She says she is sleeping 7 hours per night. Endorses racing thoughts. No current mania. No auditory or visual hallucinations. No SI/HI.   Past medication trials: Adderall Prozac Wellbutrin Lamictal Concerta Ritalin   Individual Medical History/ Review of Systems: Changes? :No   Allergies: Patient has no known allergies.  Current Medications:  Current Outpatient Medications:    FLUoxetine (PROZAC) 40 MG capsule, Take 1 capsule (40 mg total) by mouth daily., Disp: 30 capsule, Rfl: 3   Multiple Vitamin (MULTIVITAMIN WITH MINERALS) TABS, Take 1 tablet by mouth every morning. , Disp: , Rfl:    methylphenidate (RITALIN) 20 MG tablet, Take 1 tablet (20 mg total) by mouth 2 (two) times daily., Disp: 60 tablet, Rfl: 0   methylphenidate (RITALIN) 20 MG tablet, Take 1 tablet (20 mg total)  by mouth 2 (two) times daily., Disp: 60 tablet, Rfl: 0   methylphenidate (RITALIN) 20 MG tablet, Take 1 tablet (20 mg total) by mouth 2 (two) times daily., Disp: 60 tablet, Rfl: 0   ondansetron (ZOFRAN-ODT) 4 MG disintegrating tablet, Take 4 mg by mouth every 8 (eight) hours as needed for nausea. (Patient not taking: Reported on 11/29/2020), Disp: , Rfl:    oxyCODONE-acetaminophen (ROXICET) 5-325 MG per tablet, Take 2 tablets by mouth every 6 (six) hours as needed for pain. (Patient not taking: Reported on 11/29/2020), Disp: 30 tablet, Rfl: 0   promethazine (PHENERGAN) 12.5 MG tablet, Take 12.5 mg by mouth every 6 (six) hours as needed for nausea. (Patient not taking: Reported on 11/29/2020), Disp: , Rfl:    risperiDONE (RISPERDAL) 1 MG tablet, Take 1 tablet (1 mg total) by mouth daily., Disp: 30 tablet, Rfl: 3   tamsulosin (FLOMAX) 0.4 MG CAPS, Take 0.4 mg by mouth daily after supper.  (Patient not taking: Reported on 11/29/2020), Disp: , Rfl:  Medication Side Effects: none  Family Medical/ Social History: Changes? No  MENTAL HEALTH EXAM:  Weight 256 lb (116.1 kg).Body mass index is 45.35 kg/m.  General Appearance: Casual and Neat  Eye Contact:  Good  Speech:  Clear and Coherent  Volume:  Increased  Mood:  Anxious and Depressed  Affect:  Non-Congruent  Thought Process:  Coherent  Orientation:  Full (Time, Place, and Person)  Thought Content: Logical   Suicidal Thoughts:  No  Homicidal Thoughts:  No  Memory:  WNL  Judgement:  Good  Insight:  Good  Psychomotor Activity:  Increased  Concentration:  Concentration: Fair  Recall:  Good  Fund of Knowledge: Good  Language: Good  Assets:  Desire for Improvement  ADL's:  Intact  Cognition: WNL  Prognosis:  Fair    DIAGNOSES:    ICD-10-CM   1. Generalized anxiety disorder  F41.1 FLUoxetine (PROZAC) 40 MG capsule    risperiDONE (RISPERDAL) 1 MG tablet    2. Cyclothymic disorder  F34.0 FLUoxetine (PROZAC) 40 MG capsule    risperiDONE  (RISPERDAL) 1 MG tablet    3. Attention deficit hyperactivity disorder (ADHD), combined type, mild  F90.2 risperiDONE (RISPERDAL) 1 MG tablet    4. Major depressive disorder, recurrent episode, moderate (HCC)  F33.1 risperiDONE (RISPERDAL) 1 MG tablet    5. Bipolar affective disorder, currently depressed, moderate (HCC)  F31.32 risperiDONE (RISPERDAL) 1 MG tablet      Receiving Psychotherapy: No    RECOMMENDATIONS: Increase Prozac to 40 mg daily Continue previous Rx of Ritalin. Patient not requesting refill.  Continue Risperdal 1 mg daily. Will follow up in 6 weeks to reassess. Greater than 50% of 40 min. face to face time with patient was spent on counseling and coordination of care. We discussed lengthy history of depression and mood cycling. She was diagnosed previously with Bipolar disorder and has been receiving telepsychiatry from [facility]. She said that they told her she may be Borderline Personality disorder. She was placed on Risperidone. She did not want to make changes at this time.  Discussed potential metabolic side effects associated with atypical antipsychotics, as well as potential risk for movement side effects. Advised pt to contact office if movement side effects occur.   Patient wanted wanted to follow up with PCP to obtain current labs to include TSH, Lipid panel, A1C, CBC, CMP.  She agreed to monitor weights frequently.       Joan Flores, NP

## 2021-01-17 ENCOUNTER — Ambulatory Visit: Payer: BC Managed Care – PPO | Admitting: Behavioral Health

## 2021-02-01 ENCOUNTER — Encounter: Payer: Self-pay | Admitting: Behavioral Health

## 2021-02-01 ENCOUNTER — Other Ambulatory Visit: Payer: Self-pay

## 2021-02-01 ENCOUNTER — Ambulatory Visit (INDEPENDENT_AMBULATORY_CARE_PROVIDER_SITE_OTHER): Payer: 59 | Admitting: Behavioral Health

## 2021-02-01 DIAGNOSIS — F340 Cyclothymic disorder: Secondary | ICD-10-CM

## 2021-02-01 DIAGNOSIS — F902 Attention-deficit hyperactivity disorder, combined type: Secondary | ICD-10-CM

## 2021-02-01 DIAGNOSIS — F411 Generalized anxiety disorder: Secondary | ICD-10-CM

## 2021-02-01 NOTE — Progress Notes (Signed)
[facility]  Patient ID: [name],  MRN: [id number]  PCP: Loyola Mast, MD  Date of Evaluation: 02/01/2021 Time spent:20 minutes  Chief Complaint:  Chief Complaint   Anxiety; Depression; ADHD; Follow-up     HISTORY/CURRENT STATUS: HPI  25 year old female presents to this office for follow up and medication management. . She is former patient of Dr. Beverly Milch. She is smiling and joyful today. Says that increasing the Prozac and staying on the Risperidone has really helped recently. She reports depression today at 2/10 and anxiety at 2/10. Says she is in very good place mentally. She went camping over the weekend with friends and had a good time. She says she is sleeping 7 hours per night. Endorses racing thoughts. No current mania. No auditory or visual hallucinations. No SI/HI.    Past medication trials: Adderall Prozac Wellbutrin Lamictal Concerta Ritalin         Individual Medical History/ Review of Systems: Changes? :No   Allergies: Patient has no known allergies.  Current Medications:  Current Outpatient Medications:    FLUoxetine (PROZAC) 40 MG capsule, Take 1 capsule (40 mg total) by mouth daily., Disp: 30 capsule, Rfl: 3   Multiple Vitamin (MULTIVITAMIN WITH MINERALS) TABS, Take 1 tablet by mouth every morning. , Disp: , Rfl:    risperiDONE (RISPERDAL) 1 MG tablet, Take 1 tablet (1 mg total) by mouth daily., Disp: 30 tablet, Rfl: 3   methylphenidate (RITALIN) 20 MG tablet, Take 1 tablet (20 mg total) by mouth 2 (two) times daily., Disp: 60 tablet, Rfl: 0   methylphenidate (RITALIN) 20 MG tablet, Take 1 tablet (20 mg total) by mouth 2 (two) times daily., Disp: 60 tablet, Rfl: 0   methylphenidate (RITALIN) 20 MG tablet, Take 1 tablet (20 mg total) by mouth 2 (two) times daily., Disp: 60 tablet, Rfl: 0   ondansetron (ZOFRAN-ODT) 4 MG disintegrating tablet, Take 4 mg by mouth every 8 (eight) hours as needed for nausea. (Patient not taking:  Reported on 11/29/2020), Disp: , Rfl:    oxyCODONE-acetaminophen (ROXICET) 5-325 MG per tablet, Take 2 tablets by mouth every 6 (six) hours as needed for pain. (Patient not taking: Reported on 11/29/2020), Disp: 30 tablet, Rfl: 0   promethazine (PHENERGAN) 12.5 MG tablet, Take 12.5 mg by mouth every 6 (six) hours as needed for nausea. (Patient not taking: Reported on 11/29/2020), Disp: , Rfl:    tamsulosin (FLOMAX) 0.4 MG CAPS, Take 0.4 mg by mouth daily after supper.  (Patient not taking: Reported on 11/29/2020), Disp: , Rfl:  Medication Side Effects: none  Family Medical/ Social History: Changes? No  MENTAL HEALTH EXAM:  There were no vitals taken for this visit.There is no height or weight on file to calculate BMI.  General Appearance: Casual and Neat  Eye Contact:  Good  Speech:  Clear and Coherent  Volume:  Increased  Mood:  NA  Affect:  Appropriate  Thought Process:  Coherent  Orientation:  Full (Time, Place, and Person)  Thought Content: Logical   Suicidal Thoughts:  No  Homicidal Thoughts:  No  Memory:  WNL  Judgement:  Good  Insight:  Good  Psychomotor Activity:  Normal  Concentration:  Concentration: Good  Recall:  Good  Fund of Knowledge: Good  Language: Good  Assets:  Desire for Improvement  ADL's:  Intact  Cognition: WNL  Prognosis:  Good    DIAGNOSES:    ICD-10-CM   1. Generalized anxiety disorder  F41.1  2. Cyclothymic disorder  F34.0     3. Attention deficit hyperactivity disorder (ADHD), combined type, mild  F90.2       Receiving Psychotherapy: No    RECOMMENDATIONS:   Continue Prozac to 40 mg daily Continue previous Rx of Ritalin. Patient not requesting refill.  Continue Risperdal 1 mg daily. Will follow up in 3 months to reassess. Greater than 50% of 20 min. face to face time with patient was spent on counseling and coordination of care. We discussed her significant improvements with moods. She reports being in good place.  She did not want to make  changes to medications at this time. Sometimes her presentation in non-congruent. She has increased volume and high energy during today's visit. She is extremely polite and cordial.   Discussed potential metabolic side effects associated with atypical antipsychotics, as well as potential risk for movement side effects. Advised pt to contact office if movement side effects occur.   Patient wanted wanted to follow up with PCP to obtain current labs to include TSH, Lipid panel, A1C, CBC, CMP.  She agreed to monitor weights frequently.     Joan Flores, NP

## 2021-05-03 ENCOUNTER — Other Ambulatory Visit: Payer: Self-pay

## 2021-05-03 ENCOUNTER — Encounter: Payer: Self-pay | Admitting: Behavioral Health

## 2021-05-03 ENCOUNTER — Ambulatory Visit: Payer: 59 | Admitting: Behavioral Health

## 2021-05-03 DIAGNOSIS — F331 Major depressive disorder, recurrent, moderate: Secondary | ICD-10-CM

## 2021-05-03 DIAGNOSIS — F902 Attention-deficit hyperactivity disorder, combined type: Secondary | ICD-10-CM | POA: Diagnosis not present

## 2021-05-03 DIAGNOSIS — F340 Cyclothymic disorder: Secondary | ICD-10-CM | POA: Diagnosis not present

## 2021-05-03 DIAGNOSIS — F411 Generalized anxiety disorder: Secondary | ICD-10-CM

## 2021-05-03 DIAGNOSIS — F3132 Bipolar disorder, current episode depressed, moderate: Secondary | ICD-10-CM

## 2021-05-03 MED ORDER — RISPERIDONE 1 MG PO TABS: 1.0000 mg | ORAL_TABLET | Freq: Every day | ORAL | 3 refills | Status: DC

## 2021-05-03 MED ORDER — LISDEXAMFETAMINE DIMESYLATE 30 MG PO CAPS: 30.0000 mg | ORAL_CAPSULE | Freq: Every day | ORAL | 0 refills | Status: AC

## 2021-05-03 MED ORDER — FLUOXETINE HCL 40 MG PO CAPS: 40.0000 mg | ORAL_CAPSULE | Freq: Every day | ORAL | 3 refills | Status: DC

## 2021-05-03 NOTE — Progress Notes (Signed)
[facility]  Patient ID: [name],  MRN: [id number]  PCP: Loyola Mast, MD  Date of Evaluation: 05/03/2021 Time spent:30 minutes  Chief Complaint:  Chief Complaint   Anxiety; Depression; ADHD; Follow-up; Medication Refill     HISTORY/CURRENT STATUS: HPI  25 year old non-binary presents to this office for follow up and medication management. . They are former patient of Dr. Beverly Milch.  They are smiling and joyful today. Says that increasing the Prozac and staying on the Risperidone has really helped recently. They are reporting depression today at 2/10 and anxiety at 2/10. Says they are in very good place mentally. They are staying very busy at work and would like to change ADHD medication. Feels like the Ritalin is not effective anymore. Endorses racing thoughts. No current mania. No auditory or visual hallucinations. No SI/HI.    Past medication trials: Adderall Prozac Wellbutrin Lamictal Concerta Ritalin     Individual Medical History/ Review of Systems: Changes? :No   Allergies: Patient has no known allergies.  Current Medications:  Current Outpatient Medications:    lisdexamfetamine (VYVANSE) 30 MG capsule, Take 1 capsule (30 mg total) by mouth daily., Disp: 30 capsule, Rfl: 0   FLUoxetine (PROZAC) 40 MG capsule, Take 1 capsule (40 mg total) by mouth daily., Disp: 30 capsule, Rfl: 3   methylphenidate (RITALIN) 20 MG tablet, Take 1 tablet (20 mg total) by mouth 2 (two) times daily., Disp: 60 tablet, Rfl: 0   methylphenidate (RITALIN) 20 MG tablet, Take 1 tablet (20 mg total) by mouth 2 (two) times daily., Disp: 60 tablet, Rfl: 0   methylphenidate (RITALIN) 20 MG tablet, Take 1 tablet (20 mg total) by mouth 2 (two) times daily., Disp: 60 tablet, Rfl: 0   Multiple Vitamin (MULTIVITAMIN WITH MINERALS) TABS, Take 1 tablet by mouth every morning. , Disp: , Rfl:    ondansetron (ZOFRAN-ODT) 4 MG disintegrating tablet, Take 4 mg by mouth every 8 (eight)  hours as needed for nausea. (Patient not taking: Reported on 11/29/2020), Disp: , Rfl:    oxyCODONE-acetaminophen (ROXICET) 5-325 MG per tablet, Take 2 tablets by mouth every 6 (six) hours as needed for pain. (Patient not taking: Reported on 11/29/2020), Disp: 30 tablet, Rfl: 0   promethazine (PHENERGAN) 12.5 MG tablet, Take 12.5 mg by mouth every 6 (six) hours as needed for nausea. (Patient not taking: Reported on 11/29/2020), Disp: , Rfl:    risperiDONE (RISPERDAL) 1 MG tablet, Take 1 tablet (1 mg total) by mouth daily., Disp: 30 tablet, Rfl: 3   tamsulosin (FLOMAX) 0.4 MG CAPS, Take 0.4 mg by mouth daily after supper.  (Patient not taking: Reported on 11/29/2020), Disp: , Rfl:  Medication Side Effects: none  Family Medical/ Social History: Changes? No  MENTAL HEALTH EXAM:  There were no vitals taken for this visit.There is no height or weight on file to calculate BMI.  General Appearance: Casual and Neat  Eye Contact:  Good  Speech:  Clear and Coherent  Volume:  Normal  Mood:  NA  Affect:  Appropriate  Thought Process:  Coherent  Orientation:  Full (Time, Place, and Person)  Thought Content: Logical   Suicidal Thoughts:  No  Homicidal Thoughts:  No  Memory:  WNL  Judgement:  Good  Insight:  Good  Psychomotor Activity:  Normal  Concentration:  Concentration: Good  Recall:  Good  Fund of Knowledge: Good  Language: Good  Assets:  Desire for Improvement  ADL's:  Intact  Cognition: WNL  Prognosis:  Good    DIAGNOSES:    ICD-10-CM   1. Generalized anxiety disorder  F41.1 risperiDONE (RISPERDAL) 1 MG tablet    FLUoxetine (PROZAC) 40 MG capsule    2. Attention deficit hyperactivity disorder (ADHD), combined type, mild  F90.2 lisdexamfetamine (VYVANSE) 30 MG capsule    risperiDONE (RISPERDAL) 1 MG tablet    3. Cyclothymic disorder  F34.0 risperiDONE (RISPERDAL) 1 MG tablet    FLUoxetine (PROZAC) 40 MG capsule    4. Major depressive disorder, recurrent episode, moderate (HCC)  F33.1  risperiDONE (RISPERDAL) 1 MG tablet    5. Bipolar affective disorder, currently depressed, moderate (HCC)  F31.32 risperiDONE (RISPERDAL) 1 MG tablet      Receiving Psychotherapy: No    RECOMMENDATIONS:   As of this Visit pt wanted to be considered NON-Binary. They,Them, pronouns  Continue Prozac to 40 mg daily Stopped the Ritalin. Became less effective. Afternoon crash. They wanted to try new medication.  To start Vyvanse 30 mg daily. Continue Risperdal 1 mg daily. Will follow up in 3 months to reassess. Greater than 50% of 30 min. face to face time with patient was spent on counseling and coordination of care. We discussed their significant improvements with moods. They report being in good place.  They did not want to make changes to medications at this time. Sometimes their presentation in non-congruent.  They are extremely polite and cordial.   Discussed potential metabolic side effects associated with atypical antipsychotics, as well as potential risk for movement side effects. Advised pt to contact office if movement side effects occur.   Reviewed PDMP    Joan Flores, NP

## 2021-06-07 ENCOUNTER — Other Ambulatory Visit: Payer: Self-pay | Admitting: Psychiatry

## 2021-06-07 DIAGNOSIS — F411 Generalized anxiety disorder: Secondary | ICD-10-CM

## 2021-07-07 ENCOUNTER — Other Ambulatory Visit: Payer: Self-pay | Admitting: Psychiatry

## 2021-07-07 DIAGNOSIS — F411 Generalized anxiety disorder: Secondary | ICD-10-CM

## 2021-07-19 ENCOUNTER — Ambulatory Visit: Payer: 59 | Admitting: Behavioral Health

## 2021-07-24 ENCOUNTER — Ambulatory Visit: Payer: 59 | Admitting: Behavioral Health

## 2021-07-24 ENCOUNTER — Encounter: Payer: Self-pay | Admitting: Behavioral Health

## 2021-07-24 ENCOUNTER — Other Ambulatory Visit: Payer: Self-pay

## 2021-07-24 DIAGNOSIS — F331 Major depressive disorder, recurrent, moderate: Secondary | ICD-10-CM | POA: Diagnosis not present

## 2021-07-24 DIAGNOSIS — F411 Generalized anxiety disorder: Secondary | ICD-10-CM | POA: Diagnosis not present

## 2021-07-24 DIAGNOSIS — F340 Cyclothymic disorder: Secondary | ICD-10-CM

## 2021-07-24 DIAGNOSIS — F3132 Bipolar disorder, current episode depressed, moderate: Secondary | ICD-10-CM

## 2021-07-24 DIAGNOSIS — F902 Attention-deficit hyperactivity disorder, combined type: Secondary | ICD-10-CM

## 2021-07-24 MED ORDER — FLUOXETINE HCL 40 MG PO CAPS: 40.0000 mg | ORAL_CAPSULE | Freq: Every day | ORAL | 3 refills | Status: AC

## 2021-07-24 MED ORDER — RISPERIDONE 1 MG PO TABS: 1.0000 mg | ORAL_TABLET | Freq: Every day | ORAL | 3 refills | Status: AC

## 2021-07-24 MED ORDER — METHYLPHENIDATE HCL 20 MG PO TABS: 20.0000 mg | ORAL_TABLET | Freq: Every day | ORAL | 0 refills | Status: AC

## 2021-07-24 NOTE — Progress Notes (Signed)
[facility]  Patient ID: [name],  MRN: [id number]  PCP: Loyola Mast, MD  Date of Evaluation: 07/24/2021 Time spent:20 minutes  Chief Complaint:  Chief Complaint   Anxiety; Depression; ADHD; Follow-up; Medication Refill     HISTORY/CURRENT STATUS: HPI  26 year old non-binary presents to this office for follow up and medication management. . They are former patient of Dr. Beverly Milch.  They are smiling and joyful today. Says that the Prozac and staying on the Risperidone has continued to help. They are reporting depression today at 1/10 and anxiety at 1/10. Says they are in very good place mentally. They are staying very busy at work and would like to change ADHD medication. Endorses racing thoughts. No current mania. No auditory or visual hallucinations. No SI/HI.    Past medication trials: Adderall Prozac Wellbutrin Lamictal Concerta Ritalin   Individual Medical History/ Review of Systems: Changes? :No   Allergies: Patient has no known allergies.  Current Medications:  Current Outpatient Medications:    methylphenidate (RITALIN) 20 MG tablet, Take 1 tablet (20 mg total) by mouth daily after breakfast., Disp: 30 tablet, Rfl: 0   FLUoxetine (PROZAC) 40 MG capsule, Take 1 capsule (40 mg total) by mouth daily., Disp: 30 capsule, Rfl: 3   lisdexamfetamine (VYVANSE) 30 MG capsule, Take 1 capsule (30 mg total) by mouth daily., Disp: 30 capsule, Rfl: 0   methylphenidate (RITALIN) 20 MG tablet, Take 1 tablet (20 mg total) by mouth 2 (two) times daily., Disp: 60 tablet, Rfl: 0   methylphenidate (RITALIN) 20 MG tablet, Take 1 tablet (20 mg total) by mouth 2 (two) times daily., Disp: 60 tablet, Rfl: 0   methylphenidate (RITALIN) 20 MG tablet, Take 1 tablet (20 mg total) by mouth 2 (two) times daily., Disp: 60 tablet, Rfl: 0   Multiple Vitamin (MULTIVITAMIN WITH MINERALS) TABS, Take 1 tablet by mouth every morning. , Disp: , Rfl:    ondansetron (ZOFRAN-ODT) 4 MG  disintegrating tablet, Take 4 mg by mouth every 8 (eight) hours as needed for nausea. (Patient not taking: Reported on 11/29/2020), Disp: , Rfl:    oxyCODONE-acetaminophen (ROXICET) 5-325 MG per tablet, Take 2 tablets by mouth every 6 (six) hours as needed for pain. (Patient not taking: Reported on 11/29/2020), Disp: 30 tablet, Rfl: 0   promethazine (PHENERGAN) 12.5 MG tablet, Take 12.5 mg by mouth every 6 (six) hours as needed for nausea. (Patient not taking: Reported on 11/29/2020), Disp: , Rfl:    risperiDONE (RISPERDAL) 1 MG tablet, Take 1 tablet (1 mg total) by mouth daily., Disp: 30 tablet, Rfl: 3   tamsulosin (FLOMAX) 0.4 MG CAPS, Take 0.4 mg by mouth daily after supper.  (Patient not taking: Reported on 11/29/2020), Disp: , Rfl:  Medication Side Effects: none  Family Medical/ Social History: Changes? No  MENTAL HEALTH EXAM:  There were no vitals taken for this visit.There is no height or weight on file to calculate BMI.  General Appearance: Casual  Eye Contact:  Good  Speech:  Clear and Coherent  Volume:  Normal  Mood:  NA  Affect:  Appropriate  Thought Process:  Coherent  Orientation:  Full (Time, Place, and Person)  Thought Content: Logical   Suicidal Thoughts:  No  Homicidal Thoughts:  No  Memory:  WNL  Judgement:  Good  Insight:  Good  Psychomotor Activity:  Normal  Concentration:  Concentration: Good  Recall:  Good  Fund of Knowledge: Good  Language: Good  Assets:  Desire for  Improvement  ADL's:  Intact  Cognition: WNL  Prognosis:  Good    DIAGNOSES:    ICD-10-CM   1. Attention deficit hyperactivity disorder (ADHD), combined type, mild  F90.2 methylphenidate (RITALIN) 20 MG tablet    risperiDONE (RISPERDAL) 1 MG tablet    2. Generalized anxiety disorder  F41.1 FLUoxetine (PROZAC) 40 MG capsule    risperiDONE (RISPERDAL) 1 MG tablet    3. Cyclothymic disorder  F34.0 FLUoxetine (PROZAC) 40 MG capsule    risperiDONE (RISPERDAL) 1 MG tablet    4. Major depressive  disorder, recurrent episode, moderate (HCC)  F33.1 risperiDONE (RISPERDAL) 1 MG tablet    5. Bipolar affective disorder, currently depressed, moderate (HCC)  F31.32 risperiDONE (RISPERDAL) 1 MG tablet      Receiving Psychotherapy: No    RECOMMENDATIONS:   As of this Visit pt wanted to be considered NON-Binary. They,Them, pronouns   Continue Prozac to 40 mg daily Was having problems with the Vyvanse and insurance. To restart Ritalin 20 mg daily after breakfast Continue Risperdal 1 mg daily. Will follow up in 3 months to reassess. Greater than 50% of 20  min. face to face time with patient was spent on counseling and coordination of care. We discussed their significant improvements with moods. They report being in good place.  They did not want to make changes to medications at this time.   Pt did not want to order f/u labs at this time. In the process of switching jobs and insurance. Says will do with PCP soon.  Discussed potential metabolic side effects associated with atypical antipsychotics, as well as potential risk for movement side effects. Advised pt to contact office if movement side effects occur.   Pt is currently not on BC. Explained risk of medications during pregnancy.  Reviewed PDMP       Joan Flores, NP

## 2021-08-01 ENCOUNTER — Ambulatory Visit: Payer: 59 | Admitting: Behavioral Health

## 2021-09-25 ENCOUNTER — Ambulatory Visit: Payer: 59 | Admitting: Behavioral Health

## 2021-11-20 ENCOUNTER — Ambulatory Visit: Payer: 59 | Admitting: Behavioral Health

## 2021-12-11 ENCOUNTER — Ambulatory Visit: Payer: 59 | Admitting: Behavioral Health
